# Patient Record
Sex: Male | Born: 2000 | Race: White | Hispanic: No | Marital: Single | Smoking: Current every day smoker
Health system: Southern US, Community
[De-identification: ages and names within clinical notes are randomized; demographics above are authoritative.]

## PROBLEM LIST (undated history)

## (undated) DIAGNOSIS — S069X9A Unspecified intracranial injury with loss of consciousness of unspecified duration, initial encounter: Secondary | ICD-10-CM

## (undated) DIAGNOSIS — S069XAA Unspecified intracranial injury with loss of consciousness status unknown, initial encounter: Secondary | ICD-10-CM

## (undated) HISTORY — PX: TONSILLECTOMY: SUR1361

## (undated) HISTORY — PX: ADENOIDECTOMY: SHX5191

## (undated) HISTORY — PX: TYMPANOSTOMY TUBE PLACEMENT: SHX32

---

## 2020-05-28 ENCOUNTER — Emergency Department (HOSPITAL_BASED_OUTPATIENT_CLINIC_OR_DEPARTMENT_OTHER)
Admission: EM | Admit: 2020-05-28 | Discharge: 2020-05-28 | Disposition: A | Discharge status: Home / Self Care | Payer: Self-pay | Attending: Emergency Medicine | Admitting: Emergency Medicine

## 2020-05-28 ENCOUNTER — Emergency Department (HOSPITAL_BASED_OUTPATIENT_CLINIC_OR_DEPARTMENT_OTHER): Payer: Self-pay

## 2020-05-28 ENCOUNTER — Other Ambulatory Visit: Payer: Self-pay

## 2020-05-28 ENCOUNTER — Encounter (HOSPITAL_BASED_OUTPATIENT_CLINIC_OR_DEPARTMENT_OTHER): Payer: Self-pay

## 2020-05-28 DIAGNOSIS — R1011 Right upper quadrant pain: Secondary | ICD-10-CM | POA: Insufficient documentation

## 2020-05-28 DIAGNOSIS — F1010 Alcohol abuse, uncomplicated: Secondary | ICD-10-CM | POA: Insufficient documentation

## 2020-05-28 DIAGNOSIS — F17210 Nicotine dependence, cigarettes, uncomplicated: Secondary | ICD-10-CM | POA: Insufficient documentation

## 2020-05-28 DIAGNOSIS — R1010 Upper abdominal pain, unspecified: Secondary | ICD-10-CM

## 2020-05-28 DIAGNOSIS — R112 Nausea with vomiting, unspecified: Secondary | ICD-10-CM | POA: Insufficient documentation

## 2020-05-28 LAB — URINALYSIS, ROUTINE W REFLEX MICROSCOPIC
Bilirubin Urine: NEGATIVE
Glucose, UA: NEGATIVE mg/dL
Ketones, ur: NEGATIVE mg/dL
Leukocytes,Ua: NEGATIVE
Nitrite: NEGATIVE
Protein, ur: NEGATIVE mg/dL
Specific Gravity, Urine: 1.01 (ref 1.005–1.030)
pH: 7 (ref 5.0–8.0)

## 2020-05-28 LAB — CBC WITH DIFFERENTIAL/PLATELET
Abs Immature Granulocytes: 0.09 10*3/uL — ABNORMAL HIGH (ref 0.00–0.07)
Basophils Absolute: 0.1 10*3/uL (ref 0.0–0.1)
Basophils Relative: 1 %
Eosinophils Absolute: 0.1 10*3/uL (ref 0.0–0.5)
Eosinophils Relative: 1 %
HCT: 49.7 % (ref 39.0–52.0)
Hemoglobin: 16 g/dL (ref 13.0–17.0)
Immature Granulocytes: 1 %
Lymphocytes Relative: 12 %
Lymphs Abs: 1.2 10*3/uL (ref 0.7–4.0)
MCH: 28.6 pg (ref 26.0–34.0)
MCHC: 32.2 g/dL (ref 30.0–36.0)
MCV: 88.8 fL (ref 80.0–100.0)
Monocytes Absolute: 0.7 10*3/uL (ref 0.1–1.0)
Monocytes Relative: 7 %
Neutro Abs: 7.9 10*3/uL — ABNORMAL HIGH (ref 1.7–7.7)
Neutrophils Relative %: 78 %
Platelets: 288 10*3/uL (ref 150–400)
RBC: 5.6 MIL/uL (ref 4.22–5.81)
RDW: 13.5 % (ref 11.5–15.5)
WBC: 10 10*3/uL (ref 4.0–10.5)
nRBC: 0 % (ref 0.0–0.2)

## 2020-05-28 LAB — COMPREHENSIVE METABOLIC PANEL
ALT: 52 U/L — ABNORMAL HIGH (ref 0–44)
AST: 35 U/L (ref 15–41)
Albumin: 4 g/dL (ref 3.5–5.0)
Alkaline Phosphatase: 92 U/L (ref 38–126)
Anion gap: 11 (ref 5–15)
BUN: 8 mg/dL (ref 6–20)
CO2: 26 mmol/L (ref 22–32)
Calcium: 9.3 mg/dL (ref 8.9–10.3)
Chloride: 106 mmol/L (ref 98–111)
Creatinine, Ser: 0.64 mg/dL (ref 0.61–1.24)
GFR calc Af Amer: >60 mL/min (ref >60)
GFR calc non Af Amer: >60 mL/min (ref >60)
Glucose, Bld: 109 mg/dL — ABNORMAL HIGH (ref 70–99)
Potassium: 4.3 mmol/L (ref 3.5–5.1)
Sodium: 143 mmol/L (ref 135–145)
Total Bilirubin: 0.6 mg/dL (ref 0.3–1.2)
Total Protein: 7 g/dL (ref 6.5–8.1)

## 2020-05-28 LAB — URINALYSIS, MICROSCOPIC (REFLEX)
Squamous Epithelial / HPF: NONE SEEN (ref 0–5)
WBC, UA: NONE SEEN WBC/hpf (ref 0–5)

## 2020-05-28 LAB — LIPASE, BLOOD: Lipase: 24 U/L (ref 11–51)

## 2020-05-28 MED ORDER — PANTOPRAZOLE SODIUM 40 MG IV SOLR
40.0000 mg | Freq: Once | INTRAVENOUS | Status: AC
  Administered 2020-05-28: 40 mg via INTRAVENOUS
  Filled 2020-05-28: qty 40

## 2020-05-28 MED ORDER — ONDANSETRON HCL 4 MG/2ML IJ SOLN
4.0000 mg | Freq: Once | INTRAMUSCULAR | Status: AC
  Administered 2020-05-28: 4 mg via INTRAVENOUS
  Filled 2020-05-28: qty 2

## 2020-05-28 MED ORDER — ESOMEPRAZOLE MAGNESIUM 40 MG PO CPDR
40.0000 mg | DELAYED_RELEASE_CAPSULE | Freq: Every day | ORAL | 0 refills | Status: AC
Start: 2020-05-28 — End: ?

## 2020-05-28 MED ORDER — ONDANSETRON 4 MG PO TBDP: ORAL_TABLET | ORAL | 0 refills | Status: AC

## 2020-05-28 MED ORDER — SODIUM CHLORIDE 0.9 % IV BOLUS
1000.0000 mL | Freq: Once | INTRAVENOUS | Status: AC
  Administered 2020-05-28: 1000 mL via INTRAVENOUS

## 2020-05-28 NOTE — ED Notes (Signed)
Patient ambulated to US

## 2020-05-28 NOTE — ED Triage Notes (Signed)
Pt states that he woke up with pain to RUQ starting this morning reports NV.

## 2020-05-28 NOTE — Discharge Instructions (Signed)
Your work-up today is reassuring and I suspect most of your symptoms are due to inflammation and irritation of your stomach lining, likely due to alcohol use.  Please begin taking Nexium daily to help reduce stomach acid, use Zofran as needed for nausea and vomiting.  You can use Maalox as needed for breakthrough symptoms.  It is important that you reduce your alcohol intake, you can use the resources provided for help with this.

## 2020-05-28 NOTE — ED Provider Notes (Signed)
[facility] EMERGENCY DEPARTMENT Provider Note   CSN: [id number] Arrival date & time: 05/28/20  8469     History Chief Complaint  Patient presents with  . Abdominal Pain    Justin Taylor is a 19 y.o. male.  Justin Taylor is a 19 y.o. male with a history of alcohol abuse, who presents with RUQ pain and vomiting that began this morning. Patient tried to go to work today but had multiple episodes of vomiting at work and continued right upper quadrant and upper abdominal pain.  He came here today and did have some improvement in pain after having a bowel movement, reports this was normal denies any blood in his stool or emesis.  He denies any fevers or chills.  He states that he has had issues with his stomach for a long time but never with pain is severe, was previously seeing a doctor in [place] and was put on a medicine to help with acid reflux but ran out of this and then moved here, has not seen a GI doctor here.  He denies any associated chest pain or shortness of breath.  No urinary symptoms.  He does report that he vapes and also drinks anywhere from 3-8 liquor drinks daily, had 3 or 4 last night.  He has a history of elevated liver enzymes, and has not had these rechecked.  Patient states he has a strong family history of alcoholism.        History reviewed. No pertinent past medical history.  There are no problems to display for this patient.   Past Surgical History:  Procedure Laterality Date  . TONSILLECTOMY         No family history on file.  Social History   Tobacco Use  . Smoking status: Current Every Day Smoker    Types: Cigarettes  . Smokeless tobacco: Never Used  Vaping Use  . Vaping Use: Every day  Substance Use Topics  . Alcohol use: Yes  . Drug use: Never    Home Medications Prior to Admission medications   Medication Sig Start Date End Date Taking? Authorizing Provider  esomeprazole (NEXIUM) 40 MG capsule Take 1 capsule (40 mg total) by  mouth daily. 05/28/20   Dartha Lodge, PA-C  ondansetron (ZOFRAN ODT) 4 MG disintegrating tablet 4mg  ODT q4 hours prn nausea/vomit 05/28/20   07/28/20, PA-C    Allergies    Trimethoprim and Sulfamethoxazole-trimethoprim  Review of Systems   Review of Systems  Constitutional: Negative for chills and fever.  HENT: Negative.   Respiratory: Negative for cough and shortness of breath.   Gastrointestinal: Positive for abdominal pain, nausea and vomiting. Negative for blood in stool, constipation and diarrhea.  Genitourinary: Negative for dysuria and frequency.  Musculoskeletal: Negative for arthralgias and myalgias.  Skin: Negative for color change and rash.  Neurological: Negative for dizziness, syncope and light-headedness.  All other systems reviewed and are negative.   Physical Exam Updated Vital Signs BP 122/60 (BP Location: Left Arm)   Pulse 95   Temp 98.7 F (37.1 C) (Oral)   Resp 18   Ht 5\' 8"  (1.727 m)   Wt 81.6 kg   SpO2 97%   BMI 27.37 kg/m   Physical Exam Vitals and nursing note reviewed.  Constitutional:      General: He is not in acute distress.    Appearance: He is well-developed and normal weight. He is not ill-appearing or diaphoretic.  HENT:     Head: Normocephalic  and atraumatic.     Mouth/Throat:     Comments: Mucous membranes slightly dry Eyes:     General:        Right eye: No discharge.        Left eye: No discharge.     Pupils: Pupils are equal, round, and reactive to light.  Cardiovascular:     Rate and Rhythm: Normal rate and regular rhythm.     Heart sounds: Normal heart sounds.  Pulmonary:     Effort: Pulmonary effort is normal. No respiratory distress.     Breath sounds: Normal breath sounds. No wheezing or rales.  Abdominal:     General: Abdomen is flat. Bowel sounds are normal. There is no distension.     Palpations: Abdomen is soft. There is no mass.     Tenderness: There is abdominal tenderness in the right upper quadrant,  epigastric area and left upper quadrant. There is no guarding.     Comments: Abdomen is soft, nondistended, bowel sounds present throughout, there is mild tenderness across the upper abdomen, most severe in the epigastric region, no lower abdominal tenderness, no guarding or peritoneal signs  Musculoskeletal:        General: No deformity.     Cervical back: Neck supple.  Skin:    General: Skin is warm and dry.     Capillary Refill: Capillary refill takes less than 2 seconds.  Neurological:     Mental Status: He is alert.     Coordination: Coordination normal.     Comments: Speech is clear, able to follow commands Moves extremities without ataxia, coordination intact  Psychiatric:        Mood and Affect: Mood normal.        Behavior: Behavior normal.     ED Results / Procedures / Treatments   Labs (all labs ordered are listed, but only abnormal results are displayed) Labs Reviewed  URINALYSIS, ROUTINE W REFLEX MICROSCOPIC - Abnormal; Notable for the following components:      Result Value   Hgb urine dipstick TRACE (*)    All other components within normal limits  CBC WITH DIFFERENTIAL/PLATELET - Abnormal; Notable for the following components:   Neutro Abs 7.9 (*)    Abs Immature Granulocytes 0.09 (*)    All other components within normal limits  URINALYSIS, MICROSCOPIC (REFLEX) - Abnormal; Notable for the following components:   Bacteria, UA RARE (*)    All other components within normal limits  COMPREHENSIVE METABOLIC PANEL - Abnormal; Notable for the following components:   Glucose, Bld 109 (*)    ALT 52 (*)    All other components within normal limits  LIPASE, BLOOD    EKG None  Radiology US Abdomen Limited RUQ  Result Date: 05/28/2020 CLINICAL DATA:  Nausea and vomiting EXAM: ULTRASOUND ABDOMEN LIMITED RIGHT UPPER QUADRANT COMPARISON:  None. FINDINGS: Gallbladder: No gallstones or wall thickening visualized. No sonographic Murphy sign noted by sonographer. Common  bile duct: Diameter: 4 mm, normal Liver: No focal lesion identified. Within normal limits in parenchymal echogenicity. Portal vein is patent on color Doppler imaging with normal direction of blood flow towards the liver. Other: None. IMPRESSION: No sonographic etiology for abdominal pain identified. Electronically Signed   By: Meda Klinefelter MD   On: 05/28/2020 11:24    Procedures Procedures (including critical care time)  Medications Ordered in ED Medications  sodium chloride 0.9 % bolus 1,000 mL ( Intravenous Stopped 05/28/20 1205)  ondansetron (ZOFRAN) injection 4 mg (4  mg Intravenous Given 05/28/20 1103)  pantoprazole (PROTONIX) injection 40 mg (40 mg Intravenous Given 05/28/20 1103)    ED Course  I have reviewed the triage vital signs and the nursing notes.  Pertinent labs & imaging results that were available during my care of the patient were reviewed by me and considered in my medical decision making (see chart for details).    MDM Rules/Calculators/A&P                          Patient presents to the ED with complaints of abdominal pain. Patient nontoxic appearing, in no apparent distress, vitals WNL. On exam patient tender to palpation across the upper abdomen, no peritoneal signs. Will evaluate with labs and right upper quadrant ultrasound. Analgesics, anti-emetics, and fluids administered.   ER work-up reviewed:  CBC: No leukocytosis, normal hemoglobin  CMP: No significant electrolyte derangements, normal renal function, ALT of 52 but no other elevated liver enzymes, these appear to have improved from previous Lipase: WNL UA: No evidence of infection Imaging: Right upper quadrant ultrasound without evidence of acute cholecystitis or other biliary or liver disease  On repeat abdominal exam patient remains without peritoneal signs, doubt cholecystitis, pancreatitis, diverticulitis, appendicitis, bowel obstruction/perforation. Patient tolerating PO in the emergency department.   Had a long conversation with patient about decreasing his alcohol use and provided him information for alcohol treatment.  Suspect a lot of his symptoms may be due to gastritis.  Will discharge home with supportive measures. I discussed results, treatment plan, need for PCP follow-up, and return precautions with the patient. Provided opportunity for questions, patient confirmed understanding and is in agreement with plan.    Final Clinical Impression(s) / ED Diagnoses Final diagnoses:  Upper abdominal pain  Non-intractable vomiting with nausea, unspecified vomiting type  Alcohol abuse    Rx / DC Orders ED Discharge Orders         Ordered    esomeprazole (NEXIUM) 40 MG capsule  Daily     Discontinue  Reprint     05/28/20 1242    ondansetron (ZOFRAN ODT) 4 MG disintegrating tablet     Discontinue  Reprint     05/28/20 1242           Dartha Lodge, PA-C 05/28/20 1243    Alvira Monday, MD 05/28/20 2318

## 2020-05-28 NOTE — ED Notes (Signed)
ED Provider student) at bedside.

## 2020-05-28 NOTE — ED Notes (Signed)
ED Provider at bedside. 

## 2020-06-21 ENCOUNTER — Encounter (HOSPITAL_BASED_OUTPATIENT_CLINIC_OR_DEPARTMENT_OTHER): Payer: Self-pay | Admitting: Emergency Medicine

## 2020-06-21 ENCOUNTER — Other Ambulatory Visit: Payer: Self-pay

## 2020-06-21 ENCOUNTER — Emergency Department (HOSPITAL_BASED_OUTPATIENT_CLINIC_OR_DEPARTMENT_OTHER): Payer: Self-pay

## 2020-06-21 ENCOUNTER — Emergency Department (HOSPITAL_BASED_OUTPATIENT_CLINIC_OR_DEPARTMENT_OTHER)
Admission: EM | Admit: 2020-06-21 | Discharge: 2020-06-21 | Disposition: A | Discharge status: Home / Self Care | Payer: Self-pay | Attending: Emergency Medicine | Admitting: Emergency Medicine

## 2020-06-21 DIAGNOSIS — R109 Unspecified abdominal pain: Secondary | ICD-10-CM

## 2020-06-21 DIAGNOSIS — F17210 Nicotine dependence, cigarettes, uncomplicated: Secondary | ICD-10-CM | POA: Insufficient documentation

## 2020-06-21 DIAGNOSIS — Y999 Unspecified external cause status: Secondary | ICD-10-CM | POA: Insufficient documentation

## 2020-06-21 DIAGNOSIS — Y939 Activity, unspecified: Secondary | ICD-10-CM | POA: Insufficient documentation

## 2020-06-21 DIAGNOSIS — Y929 Unspecified place or not applicable: Secondary | ICD-10-CM | POA: Insufficient documentation

## 2020-06-21 DIAGNOSIS — R1030 Lower abdominal pain, unspecified: Secondary | ICD-10-CM

## 2020-06-21 DIAGNOSIS — X500XXA Overexertion from strenuous movement or load, initial encounter: Secondary | ICD-10-CM | POA: Insufficient documentation

## 2020-06-21 DIAGNOSIS — S76911A Strain of unspecified muscles, fascia and tendons at thigh level, right thigh, initial encounter: Secondary | ICD-10-CM | POA: Insufficient documentation

## 2020-06-21 DIAGNOSIS — R300 Dysuria: Secondary | ICD-10-CM

## 2020-06-21 LAB — URINALYSIS, ROUTINE W REFLEX MICROSCOPIC
Bilirubin Urine: NEGATIVE
Glucose, UA: NEGATIVE mg/dL
Ketones, ur: NEGATIVE mg/dL
Leukocytes,Ua: NEGATIVE
Nitrite: NEGATIVE
Protein, ur: NEGATIVE mg/dL
Specific Gravity, Urine: <1.005 — ABNORMAL LOW (ref 1.005–1.030)
pH: 7 (ref 5.0–8.0)

## 2020-06-21 LAB — URINALYSIS, MICROSCOPIC (REFLEX)
Bacteria, UA: NONE SEEN
Squamous Epithelial / HPF: NONE SEEN (ref 0–5)
WBC, UA: NONE SEEN WBC/hpf (ref 0–5)

## 2020-06-21 NOTE — ED Provider Notes (Signed)
[facility] DEPT [facility] Emergency Department Provider Note MRN:  [id number]  Arrival date & time: 06/21/20     Chief Complaint   Groin Pain and Dysuria   History of Present Illness   Justin Taylor is a 19 y.o. year-old male with no pertinent past medical history presenting to the ED with chief complaint of groin pain.  Some burning with urination yesterday.  Also noting some pain to the right groin region, right flank.  Worse with certain motions and positions when laying in bed.  Has been lifting a lot of heavy objects at work recently.  Denies any bowel or bladder dysfunction, no numbness or weakness to the arms or legs, no back pain, no chest pain or shortness of breath, no abdominal pain, no fever.  Review of Systems  A complete 10 system review of systems was obtained and all systems are negative except as noted in the HPI and PMH.   Patient's Health History   History reviewed. No pertinent past medical history.  Past Surgical History:  Procedure Laterality Date  . TONSILLECTOMY      No family history on file.  Social History   Socioeconomic History  . Marital status: Single    Spouse name: Not on file  . Number of children: Not on file  . Years of education: Not on file  . Highest education level: Not on file  Occupational History  . Not on file  Tobacco Use  . Smoking status: Current Every Day Smoker    Types: Cigarettes  . Smokeless tobacco: Never Used  Vaping Use  . Vaping Use: Every day  Substance and Sexual Activity  . Alcohol use: Yes  . Drug use: Never  . Sexual activity: Not on file  Other Topics Concern  . Not on file  Social History Narrative  . Not on file   Social Determinants of Health   Financial Resource Strain:   . Difficulty of Paying Living Expenses: Not on file  Food Insecurity:   . Worried About Programme researcher, broadcasting/film/video in the Last Year: Not on file  . Ran Out of Food in the Last Year: Not on file  Transportation Needs:    . Lack of Transportation (Medical): Not on file  . Lack of Transportation (Non-Medical): Not on file  Physical Activity:   . Days of Exercise per Week: Not on file  . Minutes of Exercise per Session: Not on file  Stress:   . Feeling of Stress : Not on file  Social Connections:   . Frequency of Communication with Friends and Family: Not on file  . Frequency of Social Gatherings with Friends and Family: Not on file  . Attends Religious Services: Not on file  . Active Member of Clubs or Organizations: Not on file  . Attends Banker Meetings: Not on file  . Marital Status: Not on file  Intimate Partner Violence:   . Fear of Current or Ex-Partner: Not on file  . Emotionally Abused: Not on file  . Physically Abused: Not on file  . Sexually Abused: Not on file     Physical Exam   Vitals:   06/21/20 1819 06/21/20 2137  BP: (!) 144/79 (!) 137/57  Pulse: 92 (!) 59  Resp: 18 20  Temp: 99.6 F (37.6 C)   SpO2: 100% 99%    CONSTITUTIONAL: Well-appearing, NAD NEURO:  Alert and oriented x 3, no focal deficits EYES:  eyes equal and reactive ENT/NECK:  no LAD,  no JVD CARDIO: Regular rate, well-perfused, normal S1 and S2 PULM:  CTAB no wheezing or rhonchi GI/GU:  normal bowel sounds, non-distended, non-tender MSK/SPINE:  No gross deformities, no edema SKIN:  no rash, atraumatic PSYCH:  Appropriate speech and behavior  *Additional and/or pertinent findings included in MDM below  Diagnostic and Interventional Summary    EKG Interpretation  Date/Time:    Ventricular Rate:    PR Interval:    QRS Duration:   QT Interval:    QTC Calculation:   R Axis:     Text Interpretation:        Labs Reviewed  URINALYSIS, ROUTINE W REFLEX MICROSCOPIC - Abnormal; Notable for the following components:      Result Value   Color, Urine STRAW (*)    Specific Gravity, Urine <1.005 (*)    Hgb urine dipstick TRACE (*)    All other components within normal limits  URINALYSIS,  MICROSCOPIC (REFLEX)  GC/CHLAMYDIA PROBE AMP ([place]) NOT AT [facility]    US SCROTUM W/DOPPLER  Final Result      Medications - No data to display   Procedures  /  Critical Care Procedures  ED Course and Medical Decision Making  I have reviewed the triage vital signs, the nursing notes, and pertinent available records from the EMR.  Listed above are laboratory and imaging tests that I personally ordered, reviewed, and interpreted and then considered in my medical decision making (see below for details).  Dysuria and seemingly musculoskeletal strain to the groin and/or flank.  No McBurney's point tenderness, doubt appendicitis.  Normal-appearing external genitalia, ultrasound without evidence of torsion or abnormalities to the scrotum.  Urinalysis is without evidence of infection, doubt kidney stone.  Patient is in a monogamous relationship and is not concerned about STIs though this is still a possibility.  Appropriate for discharge with reassurance and anti-inflammatories.       Elmer Sow. Pilar Plate, MD [facility] Emergency Medicine [facility] [email or web address]  Final Clinical Impressions(s) / ED Diagnoses     ICD-10-CM   1. Dysuria  R30.0   2. Groin pain  R10.30 US SCROTUM W/DOPPLER    US SCROTUM W/DOPPLER  3. Flank pain  R10.9     ED Discharge Orders    None       Discharge Instructions Discussed with and Provided to Patient:     Discharge Instructions     You were evaluated in the Emergency Department and after careful evaluation, we did not find any emergent condition requiring admission or further testing in the hospital.  Your exam/testing today is overall reassuring.  Your symptoms seem to be due to strain or sprain of the muscles.  Please use Tylenol or Motrin at home for discomfort and rest for the next 24 to 48 hours.  Please return to the Emergency Department if you experience any worsening of your condition.   Thank you for allowing [place] to  be a part of your care.      Sabas Sous, MD 06/21/20 2241

## 2020-06-21 NOTE — ED Triage Notes (Signed)
Right groin pain off and on since yesterday, worse today, c/o dysuria. Denies swelling or severe pain at present.

## 2020-06-21 NOTE — Discharge Instructions (Signed)
You were evaluated in the Emergency Department and after careful evaluation, we did not find any emergent condition requiring admission or further testing in the hospital.  Your exam/testing today is overall reassuring.  Your symptoms seem to be due to strain or sprain of the muscles.  Please use Tylenol or Motrin at home for discomfort and rest for the next 24 to 48 hours.  Please return to the Emergency Department if you experience any worsening of your condition.   Thank you for allowing [place] to be a part of your care.

## 2020-06-24 LAB — GC/CHLAMYDIA PROBE AMP (~~LOC~~) NOT AT ARMC
Chlamydia: NEGATIVE
Comment: NEGATIVE
Comment: NORMAL
Neisseria Gonorrhea: NEGATIVE

## 2020-08-07 ENCOUNTER — Encounter (HOSPITAL_BASED_OUTPATIENT_CLINIC_OR_DEPARTMENT_OTHER): Payer: Self-pay | Admitting: Emergency Medicine

## 2020-08-07 ENCOUNTER — Other Ambulatory Visit: Payer: Self-pay

## 2020-08-07 ENCOUNTER — Emergency Department (HOSPITAL_BASED_OUTPATIENT_CLINIC_OR_DEPARTMENT_OTHER)
Admission: EM | Admit: 2020-08-07 | Discharge: 2020-08-07 | Disposition: A | Discharge status: Home / Self Care | Payer: Self-pay | Attending: Emergency Medicine | Admitting: Emergency Medicine

## 2020-08-07 DIAGNOSIS — Y9289 Other specified places as the place of occurrence of the external cause: Secondary | ICD-10-CM | POA: Insufficient documentation

## 2020-08-07 DIAGNOSIS — W311XXA Contact with metalworking machines, initial encounter: Secondary | ICD-10-CM | POA: Insufficient documentation

## 2020-08-07 DIAGNOSIS — T1591XA Foreign body on external eye, part unspecified, right eye, initial encounter: Secondary | ICD-10-CM | POA: Diagnosis present

## 2020-08-07 DIAGNOSIS — K2920 Alcoholic gastritis without bleeding: Secondary | ICD-10-CM | POA: Diagnosis not present

## 2020-08-07 DIAGNOSIS — Y990 Civilian activity done for income or pay: Secondary | ICD-10-CM | POA: Diagnosis not present

## 2020-08-07 DIAGNOSIS — F17210 Nicotine dependence, cigarettes, uncomplicated: Secondary | ICD-10-CM | POA: Diagnosis not present

## 2020-08-07 DIAGNOSIS — Z23 Encounter for immunization: Secondary | ICD-10-CM | POA: Insufficient documentation

## 2020-08-07 LAB — COMPREHENSIVE METABOLIC PANEL
ALT: 47 U/L — ABNORMAL HIGH (ref 0–44)
AST: 37 U/L (ref 15–41)
Albumin: 4.4 g/dL (ref 3.5–5.0)
Alkaline Phosphatase: 78 U/L (ref 38–126)
Anion gap: 10 (ref 5–15)
BUN: 11 mg/dL (ref 6–20)
CO2: 26 mmol/L (ref 22–32)
Calcium: 9.7 mg/dL (ref 8.9–10.3)
Chloride: 105 mmol/L (ref 98–111)
Creatinine, Ser: 0.7 mg/dL (ref 0.61–1.24)
GFR, Estimated: >60 mL/min (ref >60)
Glucose, Bld: 89 mg/dL (ref 70–99)
Potassium: 3.8 mmol/L (ref 3.5–5.1)
Sodium: 141 mmol/L (ref 135–145)
Total Bilirubin: 0.4 mg/dL (ref 0.3–1.2)
Total Protein: 7.1 g/dL (ref 6.5–8.1)

## 2020-08-07 LAB — CBC WITH DIFFERENTIAL/PLATELET
Abs Immature Granulocytes: 0.05 10*3/uL (ref 0.00–0.07)
Basophils Absolute: 0.1 10*3/uL (ref 0.0–0.1)
Basophils Relative: 1 %
Eosinophils Absolute: 0.5 10*3/uL (ref 0.0–0.5)
Eosinophils Relative: 4 %
HCT: 45.2 % (ref 39.0–52.0)
Hemoglobin: 14.6 g/dL (ref 13.0–17.0)
Immature Granulocytes: 1 %
Lymphocytes Relative: 19 %
Lymphs Abs: 2.1 10*3/uL (ref 0.7–4.0)
MCH: 29.3 pg (ref 26.0–34.0)
MCHC: 32.3 g/dL (ref 30.0–36.0)
MCV: 90.8 fL (ref 80.0–100.0)
Monocytes Absolute: 1.3 10*3/uL — ABNORMAL HIGH (ref 0.1–1.0)
Monocytes Relative: 12 %
Neutro Abs: 7 10*3/uL (ref 1.7–7.7)
Neutrophils Relative %: 63 %
Platelets: 278 10*3/uL (ref 150–400)
RBC: 4.98 MIL/uL (ref 4.22–5.81)
RDW: 14.1 % (ref 11.5–15.5)
WBC: 10.9 10*3/uL — ABNORMAL HIGH (ref 4.0–10.5)
nRBC: 0 % (ref 0.0–0.2)

## 2020-08-07 LAB — URINALYSIS, ROUTINE W REFLEX MICROSCOPIC
Bilirubin Urine: NEGATIVE
Glucose, UA: NEGATIVE mg/dL
Ketones, ur: NEGATIVE mg/dL
Leukocytes,Ua: NEGATIVE
Nitrite: NEGATIVE
Protein, ur: NEGATIVE mg/dL
Specific Gravity, Urine: <1.005 — ABNORMAL LOW (ref 1.005–1.030)
pH: 6.5 (ref 5.0–8.0)

## 2020-08-07 LAB — URINALYSIS, MICROSCOPIC (REFLEX)

## 2020-08-07 LAB — LIPASE, BLOOD: Lipase: 30 U/L (ref 11–51)

## 2020-08-07 MED ORDER — ERYTHROMYCIN 5 MG/GM OP OINT
TOPICAL_OINTMENT | Freq: Once | OPHTHALMIC | Status: AC
  Administered 2020-08-07: 1 via OPHTHALMIC
  Filled 2020-08-07: qty 3.5

## 2020-08-07 MED ORDER — TETANUS-DIPHTH-ACELL PERTUSSIS 5-2.5-18.5 LF-MCG/0.5 IM SUSP
0.5000 mL | Freq: Once | INTRAMUSCULAR | Status: AC
  Administered 2020-08-07: 0.5 mL via INTRAMUSCULAR
  Filled 2020-08-07: qty 0.5

## 2020-08-07 MED ORDER — TETRACAINE HCL 0.5 % OP SOLN
1.0000 [drp] | Freq: Once | OPHTHALMIC | Status: AC
  Administered 2020-08-07: 1 [drp] via OPHTHALMIC
  Filled 2020-08-07: qty 4

## 2020-08-07 NOTE — ED Provider Notes (Signed)
[facility] EMERGENCY DEPARTMENT Provider Note   CSN: [id number] Arrival date & time: 08/07/20  1825     History Chief Complaint  Patient presents with  . metal in eye    Justin Taylor is a 19 y.o. male.  Patient is a 19 year old male with no significant past medical history who presents today because he has something in his right eye.  Patient reports earlier today he felt a burning and stinging when he would blink and he thought he just had an eyelash in his eye.  However after showering and looking in the mirror he noticed there was metal in his eye.  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  He went back to work because they have an eye magnet but they were not able to get the metal out.  He denies any change in his vision and reports just every time he blinks it stings.  He has no other complaints at this time.  The history is provided by the patient.       History reviewed. No pertinent past medical history.  There are no problems to display for this patient.   Past Surgical History:  Procedure Laterality Date  . TONSILLECTOMY    . TYMPANOSTOMY TUBE PLACEMENT         No family history on file.  Social History   Tobacco Use  . Smoking status: Current Every Day Smoker    Packs/day: 0.10    Types: Cigarettes  . Smokeless tobacco: Never Used  Vaping Use  . Vaping Use: Every day  Substance Use Topics  . Alcohol use: Yes    Comment: daily beer  . Drug use: Never    Home Medications Prior to Admission medications   Medication Sig Start Date End Date Taking? Authorizing Provider  esomeprazole (NEXIUM) 40 MG capsule Take 1 capsule (40 mg total) by mouth daily. 05/28/20   Dartha Lodge, PA-C  ondansetron (ZOFRAN ODT) 4 MG disintegrating tablet 4mg  ODT q4 hours prn nausea/vomit 05/28/20   07/28/20, PA-C    Allergies    Primsol [trimethoprim] and  Sulfamethoxazole-trimethoprim  Review of Systems   Review of Systems  All other systems reviewed and are negative.   Physical Exam Updated Vital Signs BP 137/77 (BP Location: Left Arm)   Pulse (!) 104   Temp 99 F (37.2 C) (Oral)   Resp 18   Ht 5\' 7"  (1.702 m)   Wt 77.1 kg   SpO2 100%   BMI 26.63 kg/m   Physical Exam Vitals and nursing note reviewed.  Constitutional:      General: He is not in acute distress.    Appearance: Normal appearance. He is normal weight.  HENT:     Head: Normocephalic.     Nose: Nose normal.     Mouth/Throat:     Mouth: Mucous membranes are moist.  Eyes:     General: Lids are normal. Lids are everted, no foreign bodies appreciated. Vision grossly intact. Gaze aligned appropriately.        Right eye: No discharge.     Extraocular Movements: Extraocular movements intact.     Conjunctiva/sclera:     Right eye: Right conjunctiva is injected. No hemorrhage.    Pupils: Pupils are equal, round, and reactive to light.     Slit lamp exam:    Right eye: Foreign body present. No hyphema, hypopyon  or photophobia.   Cardiovascular:     Rate and Rhythm: Tachycardia present.  Pulmonary:     Effort: Pulmonary effort is normal.     Breath sounds: Normal breath sounds. No wheezing.  Chest:     Chest wall: No tenderness.  Abdominal:     General: Abdomen is flat.     Tenderness: There is no guarding or rebound.     Comments: Slight right lower quadrant and right CVA tenderness  Skin:    General: Skin is warm.  Neurological:     Mental Status: He is alert. Mental status is at baseline.  Psychiatric:        Mood and Affect: Mood normal.        Behavior: Behavior normal.        Thought Content: Thought content normal.      ED Results / Procedures / Treatments   Labs (all labs ordered are listed, but only abnormal results are displayed) Labs Reviewed - No data to display  EKG None  Radiology No results found.  Procedures .Foreign Body  Removal  Date/Time: 08/07/2020 7:21 PM Performed by: Gwyneth Sprout, MD Authorized by: Gwyneth Sprout, MD  Body area: eye Location details: right cornea Anesthesia: local infiltration  Anesthesia: Local Anesthetic: tetracaine drops  Sedation: Patient sedated: no  Patient restrained: no Patient cooperative: yes Localization method: visualized Removal mechanism: 25-gauge needle Eye not examined with fluorescein. Corneal abrasion size: small No residual rust ring present. Dressing: antibiotic ointment Depth: embedded Complexity: simple 1 objects recovered. Objects recovered: metal Post-procedure assessment: foreign body removed Patient tolerance: patient tolerated the procedure well with no immediate complications   (including critical care time)  Medications Ordered in ED Medications  tetracaine (PONTOCAINE) 0.5 % ophthalmic solution 1 drop (has no administration in time range)    ED Course  I have reviewed the triage vital signs and the nursing notes.  Pertinent labs & imaging results that were available during my care of the patient were reviewed by me and considered in my medical decision making (see chart for details).    MDM Rules/Calculators/A&P                          Patient presenting today with a piece of metal in his right eye.  This occurred while he was at work.  Patient has no visual complaints and only mild localized irritation.  Will attempt removal after areas anesthetized.    7:23 PM When going back into remove the foreign body patient's girlfriend is now present and reports they actually have something else that he wants to talk about.  Patient turned 19 on Monday and reports went out drinking and drink heavily.  On Tuesday morning he woke up and he has had ongoing abdominal pain since that time.  It is lower pain that radiates to his right back.  He is urinating frequently but denies dysuria.  He had one episode of nausea and vomiting on  Tuesday morning but has had no further nausea or vomiting.  He has been eating but less than usual.  He has not noticed any darkening of his urine and denies any fevers.   8:41 PM Labs are reassuring.  Suspect patient has some alcoholic gastritis from heavy drinking on Monday before symptoms started.  Foreign body was removed as above.  Patient given erythromycin ointment and ophthalmology follow-up if symptoms of the eye worsen.  MDM Number of Diagnoses or Management Options  Amount and/or Complexity of Data Reviewed Clinical lab tests: ordered Independent visualization of images, tracings, or specimens: yes  Risk of Complications, Morbidity, and/or Mortality Presenting problems: moderate Diagnostic procedures: low Management options: low  Patient Progress Patient progress: stable  Final Clinical Impression(s) / ED Diagnoses Final diagnoses:  Foreign body in eye, right, initial encounter  Acute alcoholic gastritis without hemorrhage    Rx / DC Orders ED Discharge Orders    None       Gwyneth Sprout, MD 08/07/20 2042

## 2020-08-07 NOTE — Discharge Instructions (Signed)
Use the ointment in your eye 2 times a day for the next few days.  If you are still experiencing any symptoms in 3 to 4 days you need to see the eye doctor to have a recheck.  Avoid alcohol and continue eating a bland diet for the next few days and your abdominal pain will most likely just go away.  All your labs look normal today.  Make sure you are drinking plenty of fluids to stay hydrated.

## 2020-08-07 NOTE — ED Triage Notes (Signed)
Piece of metal in right eye since about noon.  Pt thought he had an eyelash in his eye all afternoon but after he got home and took his shower and looked in the mirror he saw the piece of metal. Pt is a Psychologist, occupational.

## 2020-08-12 ENCOUNTER — Other Ambulatory Visit: Payer: Self-pay

## 2020-08-12 DIAGNOSIS — Z20822 Contact with and (suspected) exposure to covid-19: Secondary | ICD-10-CM

## 2020-08-13 LAB — NOVEL CORONAVIRUS, NAA: SARS-CoV-2, NAA: NOT DETECTED

## 2020-08-13 LAB — SARS-COV-2, NAA 2 DAY TAT

## 2020-09-10 ENCOUNTER — Emergency Department (HOSPITAL_BASED_OUTPATIENT_CLINIC_OR_DEPARTMENT_OTHER): Payer: Self-pay

## 2020-09-10 ENCOUNTER — Other Ambulatory Visit: Payer: Self-pay

## 2020-09-10 ENCOUNTER — Encounter (HOSPITAL_BASED_OUTPATIENT_CLINIC_OR_DEPARTMENT_OTHER): Payer: Self-pay | Admitting: Emergency Medicine

## 2020-09-10 ENCOUNTER — Emergency Department (HOSPITAL_BASED_OUTPATIENT_CLINIC_OR_DEPARTMENT_OTHER)
Admission: EM | Admit: 2020-09-10 | Discharge: 2020-09-10 | Disposition: A | Discharge status: Home / Self Care | Payer: Self-pay | Attending: Emergency Medicine | Admitting: Emergency Medicine

## 2020-09-10 DIAGNOSIS — M549 Dorsalgia, unspecified: Secondary | ICD-10-CM | POA: Insufficient documentation

## 2020-09-10 DIAGNOSIS — F17210 Nicotine dependence, cigarettes, uncomplicated: Secondary | ICD-10-CM | POA: Insufficient documentation

## 2020-09-10 DIAGNOSIS — R0789 Other chest pain: Secondary | ICD-10-CM

## 2020-09-10 DIAGNOSIS — R079 Chest pain, unspecified: Secondary | ICD-10-CM | POA: Insufficient documentation

## 2020-09-10 HISTORY — DX: Unspecified intracranial injury with loss of consciousness of unspecified duration, initial encounter: S06.9X9A

## 2020-09-10 HISTORY — DX: Unspecified intracranial injury with loss of consciousness status unknown, initial encounter: S06.9XAA

## 2020-09-10 MED ORDER — LIDOCAINE VISCOUS HCL 2 % MT SOLN
15.0000 mL | Freq: Once | OROMUCOSAL | Status: AC
  Administered 2020-09-10: 15 mL via ORAL
  Filled 2020-09-10: qty 15

## 2020-09-10 MED ORDER — METHOCARBAMOL 500 MG PO TABS: 500.0000 mg | ORAL_TABLET | Freq: Three times a day (TID) | ORAL | 0 refills | Status: AC | PRN

## 2020-09-10 MED ORDER — ALUM & MAG HYDROXIDE-SIMETH 200-200-20 MG/5ML PO SUSP
30.0000 mL | Freq: Once | ORAL | Status: AC
  Administered 2020-09-10: 30 mL via ORAL
  Filled 2020-09-10: qty 30

## 2020-09-10 NOTE — ED Notes (Signed)
Pt ambulatory to bathroom by self.  ° °

## 2020-09-10 NOTE — ED Notes (Signed)
Patient transported to X-ray 

## 2020-09-10 NOTE — ED Provider Notes (Signed)
[facility] EMERGENCY DEPARTMENT Provider Note   CSN: [id number] Arrival date & time: 09/10/20  4098     History Chief Complaint  Patient presents with  . Chest wall pain    Justin Taylor is a 19 y.o. male.  HPI Patient presents with chest and back pain.  States began last night.  In the anterior chest into the back.  States on both sides.  Had been feeling a little bad at work yesterday.  However at 8:00 when he was playing videogames developed severe pain in his chest and then into the back.  Has been severe since then.  Worse with movement.  Worse with some deep breaths.  Does not feel short of breath.  Does have a rare cough.  Has not had his Covid vaccines because he does not want to take that.  States he has had some problems like this in the past and was told he had pulled a muscle near his heart.    Past Medical History:  Diagnosis Date  . TBI (traumatic brain injury) (HCC)     There are no problems to display for this patient.   Past Surgical History:  Procedure Laterality Date  . ADENOIDECTOMY    . TONSILLECTOMY    . TYMPANOSTOMY TUBE PLACEMENT         Family History  Problem Relation Age of Onset  . Hypertension Other     Social History   Tobacco Use  . Smoking status: Current Every Day Smoker    Packs/day: 0.10    Types: Cigarettes  . Smokeless tobacco: Never Used  Vaping Use  . Vaping Use: Every day  . Substances: Nicotine  Substance Use Topics  . Alcohol use: Yes    Comment: daily beer  . Drug use: Never    Home Medications Prior to Admission medications   Medication Sig Start Date End Date Taking? Authorizing Provider  esomeprazole (NEXIUM) 40 MG capsule Take 1 capsule (40 mg total) by mouth daily. 05/28/20   Dartha Lodge, PA-C  ondansetron (ZOFRAN ODT) 4 MG disintegrating tablet 4mg  ODT q4 hours prn nausea/vomit 05/28/20   07/28/20, PA-C    Allergies    Primsol [trimethoprim] and Sulfamethoxazole-trimethoprim  Review  of Systems   Review of Systems  Constitutional: Negative for appetite change.  HENT: Negative for congestion.   Respiratory: Negative for shortness of breath.   Cardiovascular: Positive for chest pain.  Gastrointestinal: Negative for abdominal pain, nausea and vomiting.  Genitourinary: Negative for flank pain.  Musculoskeletal: Positive for back pain.  Skin: Negative for rash.  Neurological: Negative for tremors and weakness.  Psychiatric/Behavioral: Negative for confusion.    Physical Exam Updated Vital Signs BP 129/68   Pulse 62   Temp 98.5 F (36.9 C) (Oral)   Resp 19   Ht 5\' 8"  (1.727 m)   Wt 81.6 kg   SpO2 100%   BMI 27.37 kg/m   Physical Exam Vitals reviewed.  HENT:     Head: Atraumatic.     Mouth/Throat:     Mouth: Mucous membranes are moist.  Eyes:     Pupils: Pupils are equal, round, and reactive to light.  Cardiovascular:     Rate and Rhythm: Normal rate and regular rhythm.  Pulmonary:     Breath sounds: No wheezing or rhonchi.  Chest:     Chest wall: Tenderness present.  Abdominal:     Tenderness: There is no abdominal tenderness.  Musculoskeletal:  General: No tenderness.     Right lower leg: No edema.     Left lower leg: No edema.     Comments: Some tenderness to anterior chest and back.  No rash.  No deformity.  Skin:    Capillary Refill: Capillary refill takes less than 2 seconds.     Coloration: Skin is not jaundiced.  Neurological:     Mental Status: He is alert and oriented to person, place, and time.     ED Results / Procedures / Treatments   Labs (all labs ordered are listed, but only abnormal results are displayed) Labs Reviewed - No data to display  EKG EKG Interpretation  Date/Time:  Tuesday September 10 2020 06:49:18 EST Ventricular Rate:  67 PR Interval:    QRS Duration: 96 QT Interval:  380 QTC Calculation: 402 R Axis:   104 Text Interpretation: Sinus arrhythmia Borderline right axis deviation Baseline wander in  lead(s) V1 No old tracing to compare Confirmed by Benjiman Core [phone]) on 09/10/2020 7:08:16 AM   Radiology DG Chest 2 View  Result Date: 09/10/2020 CLINICAL DATA:  Chest pain. EXAM: CHEST - 2 VIEW COMPARISON:  No prior. FINDINGS: Mediastinum hilar structures normal. Lungs are clear. No pleural effusion or pneumothorax. Heart size normal. Mild thoracic spine scoliosis. No acute bony abnormality. IMPRESSION: No acute cardiopulmonary disease. Electronically Signed   By: Maisie Fus  Register   On: 09/10/2020 06:53    Procedures Procedures (including critical care time)  Medications Ordered in ED Medications  alum & mag hydroxide-simeth (MAALOX/MYLANTA) 200-200-20 MG/5ML suspension 30 mL (30 mLs Oral Given 09/10/20 0718)    And  lidocaine (XYLOCAINE) 2 % viscous mouth solution 15 mL (15 mLs Oral Given 09/10/20 6045)    ED Course  I have reviewed the triage vital signs and the nursing notes.  Pertinent labs & imaging results that were available during my care of the patient were reviewed by me and considered in my medical decision making (see chart for details).    MDM Rules/Calculators/A&P                          Patient with chest pain going to the back. Worse with movements. Began last night. 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 Began while playing videogames. EKG reassuring. X-ray reassuring. GI cocktail did not change pain. I think likely musculoskeletal pain. Will treat with muscle relaxers and can take Motrin Tylenol at home. Will discharge. Final Clinical Impression(s) / ED Diagnoses Final diagnoses:  Chest pain    Rx / DC Orders ED Discharge Orders    None       Benjiman Core, MD 09/10/20 [phone]

## 2020-09-10 NOTE — ED Triage Notes (Signed)
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

## 2020-09-30 ENCOUNTER — Other Ambulatory Visit: Payer: Self-pay

## 2020-09-30 ENCOUNTER — Emergency Department (HOSPITAL_BASED_OUTPATIENT_CLINIC_OR_DEPARTMENT_OTHER)
Admission: EM | Admit: 2020-09-30 | Discharge: 2020-09-30 | Disposition: A | Discharge status: Home / Self Care | Payer: Self-pay | Attending: Emergency Medicine | Admitting: Emergency Medicine

## 2020-09-30 ENCOUNTER — Encounter (HOSPITAL_BASED_OUTPATIENT_CLINIC_OR_DEPARTMENT_OTHER): Payer: Self-pay

## 2020-09-30 DIAGNOSIS — R059 Cough, unspecified: Secondary | ICD-10-CM | POA: Insufficient documentation

## 2020-09-30 DIAGNOSIS — Z20822 Contact with and (suspected) exposure to covid-19: Secondary | ICD-10-CM | POA: Insufficient documentation

## 2020-09-30 DIAGNOSIS — F17210 Nicotine dependence, cigarettes, uncomplicated: Secondary | ICD-10-CM | POA: Insufficient documentation

## 2020-09-30 DIAGNOSIS — R439 Unspecified disturbances of smell and taste: Secondary | ICD-10-CM | POA: Insufficient documentation

## 2020-09-30 DIAGNOSIS — R112 Nausea with vomiting, unspecified: Secondary | ICD-10-CM | POA: Insufficient documentation

## 2020-09-30 DIAGNOSIS — R0989 Other specified symptoms and signs involving the circulatory and respiratory systems: Secondary | ICD-10-CM | POA: Insufficient documentation

## 2020-09-30 DIAGNOSIS — R197 Diarrhea, unspecified: Secondary | ICD-10-CM | POA: Insufficient documentation

## 2020-09-30 DIAGNOSIS — J069 Acute upper respiratory infection, unspecified: Secondary | ICD-10-CM

## 2020-09-30 LAB — RESP PANEL BY RT-PCR (FLU A&B, COVID) ARPGX2
Influenza A by PCR: NEGATIVE
Influenza B by PCR: NEGATIVE
SARS Coronavirus 2 by RT PCR: NEGATIVE

## 2020-09-30 NOTE — ED Triage Notes (Signed)
Pt reports cough, sore throat, and n/v/d. Pt states due to a previous incident he cannot smell and can barely taste. Today he noticed he can no longer taste. Pt reports abdominal and lower back pain. Pt states his symptoms started on Friday. Pt recently traveled to [place].

## 2020-09-30 NOTE — Discharge Instructions (Addendum)
Take over-the-counter medications as needed for symptomatic relief.  Return to the emergency department if you develop chest pains, difficulty breathing, or other new and concerning symptoms.

## 2020-09-30 NOTE — ED Provider Notes (Signed)
[facility] EMERGENCY DEPARTMENT Provider Note   CSN: [id number] Arrival date & time: 09/30/20  4097     History Chief Complaint  Patient presents with  . Cough  . Abdominal Pain    Justin Taylor is a 19 y.o. male.  Patient is a 19 year old male with history of traumatic brain injury.  Patient presents today with complaints of cough, scratchy throat, nausea, vomiting, and diarrhea.  This is been ongoing for the past 3 days.  All has been nonbloody.  He reports loss of taste.  He denies any Covid exposures.  He denies prior Covid infection and has not received the vaccine.  He went to work this morning, but was told to leave secondary to this illness.  He denies any aggravating or alleviating factors  The history is provided by the patient.       Past Medical History:  Diagnosis Date  . TBI (traumatic brain injury) (HCC)     There are no problems to display for this patient.   Past Surgical History:  Procedure Laterality Date  . ADENOIDECTOMY    . TONSILLECTOMY    . TYMPANOSTOMY TUBE PLACEMENT         Family History  Problem Relation Age of Onset  . Hypertension Other     Social History   Tobacco Use  . Smoking status: Current Every Day Smoker    Packs/day: 0.10    Types: Cigarettes  . Smokeless tobacco: Never Used  Vaping Use  . Vaping Use: Every day  . Substances: Nicotine  Substance Use Topics  . Alcohol use: Yes    Comment: daily beer  . Drug use: Never    Home Medications Prior to Admission medications   Medication Sig Start Date End Date Taking? Authorizing Provider  esomeprazole (NEXIUM) 40 MG capsule Take 1 capsule (40 mg total) by mouth daily. 05/28/20   Dartha Lodge, PA-C  methocarbamol (ROBAXIN) 500 MG tablet Take 1 tablet (500 mg total) by mouth every 8 (eight) hours as needed for muscle spasms. 09/10/20   Benjiman Core, MD  ondansetron (ZOFRAN ODT) 4 MG disintegrating tablet 4mg  ODT q4 hours prn nausea/vomit 05/28/20   07/28/20, PA-C    Allergies    Primsol [trimethoprim] and Sulfamethoxazole-trimethoprim  Review of Systems   Review of Systems  All other systems reviewed and are negative.   Physical Exam Updated Vital Signs BP 123/81 (BP Location: Right Arm)   Pulse (!) 112   Temp 99.7 F (37.6 C) (Oral)   Resp 18   Ht 5\' 8"  (1.727 m)   Wt 81.9 kg   SpO2 100%   BMI 27.46 kg/m   Physical Exam Vitals and nursing note reviewed.  Constitutional:      General: He is not in acute distress.    Appearance: He is well-developed. He is not diaphoretic.  HENT:     Head: Normocephalic and atraumatic.     Mouth/Throat:     Mouth: Mucous membranes are moist.     Pharynx: No pharyngeal swelling or oropharyngeal exudate.  Cardiovascular:     Rate and Rhythm: Normal rate and regular rhythm.     Heart sounds: No murmur heard.  No friction rub.  Pulmonary:     Effort: Pulmonary effort is normal. No respiratory distress.     Breath sounds: Normal breath sounds. No wheezing or rales.  Abdominal:     General: Bowel sounds are normal. There is no distension.  Palpations: Abdomen is soft.     Tenderness: There is no abdominal tenderness.  Musculoskeletal:        General: Normal range of motion.     Cervical back: Normal range of motion and neck supple.  Skin:    General: Skin is warm and dry.  Neurological:     Mental Status: He is alert and oriented to person, place, and time.     Coordination: Coordination normal.     ED Results / Procedures / Treatments   Labs (all labs ordered are listed, but only abnormal results are displayed) Labs Reviewed - No data to display  EKG None  Radiology No results found.  Procedures Procedures (including critical care time)  Medications Ordered in ED Medications - No data to display  ED Course  I have reviewed the triage vital signs and the nursing notes.  Pertinent labs & imaging results that were available during my care of the patient were  reviewed by me and considered in my medical decision making (see chart for details).    MDM Rules/Calculators/A&P  Covid test is negative.  Symptoms likely viral in nature.  Patient seems appropriate for discharge with as needed return.  Final Clinical Impression(s) / ED Diagnoses Final diagnoses:  None    Rx / DC Orders ED Discharge Orders    None       Geoffery Lyons, MD 09/30/20 [phone]

## 2021-08-10 IMAGING — US US ABDOMEN LIMITED
1 series · 14 of 25 positions shown · non-contrast
Comparison: None.

CLINICAL DATA: Nausea and vomiting

EXAM:
ULTRASOUND ABDOMEN LIMITED RIGHT UPPER QUADRANT

[Series 1: us abdomen limited · 14 of 31 slices shown]
[im 1/31]
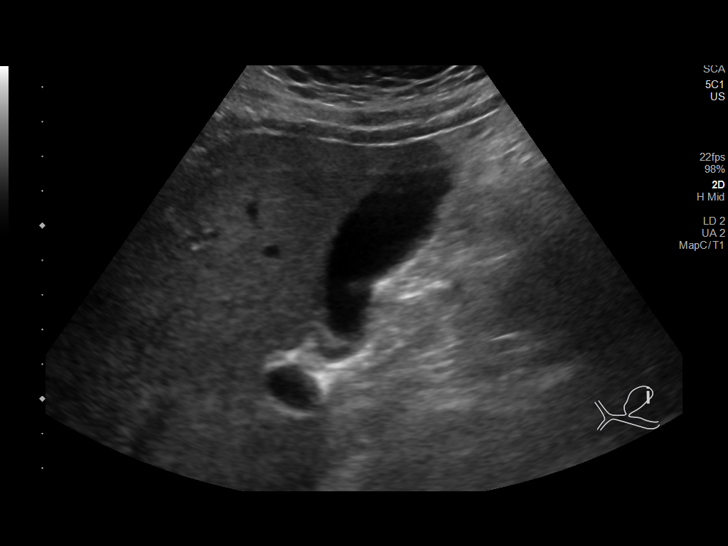
[im 3/31]
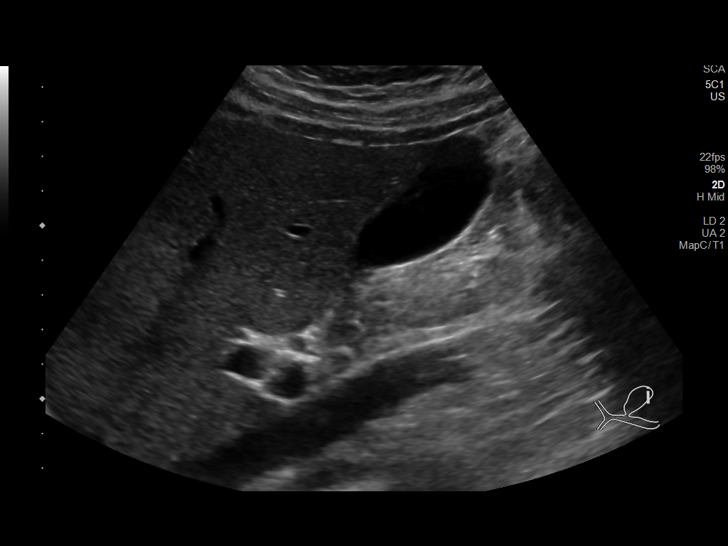
[im 6/31]
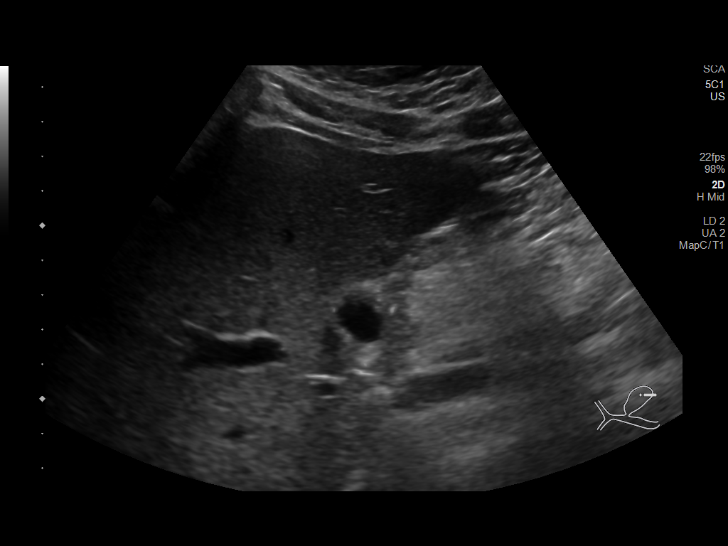
[im 8/31]
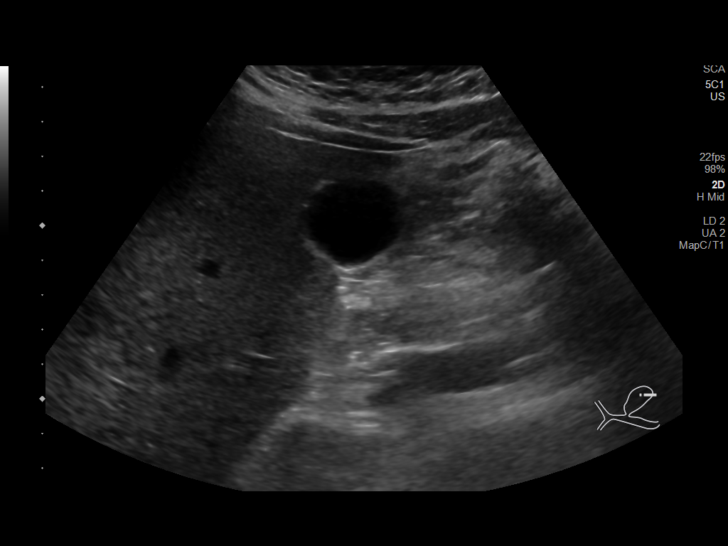
[im 11/31]
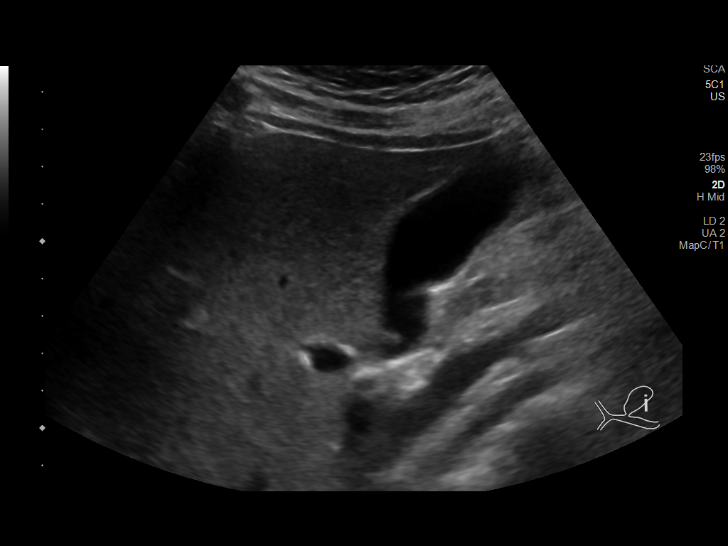
[im 12/31]
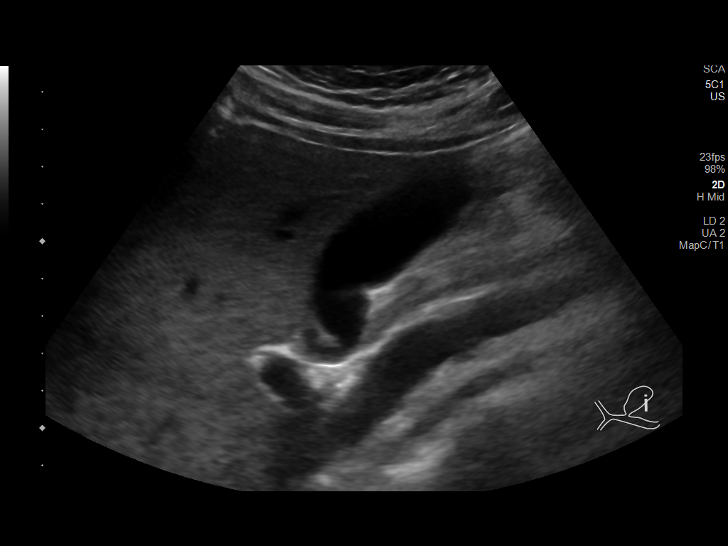
[im 14/31]
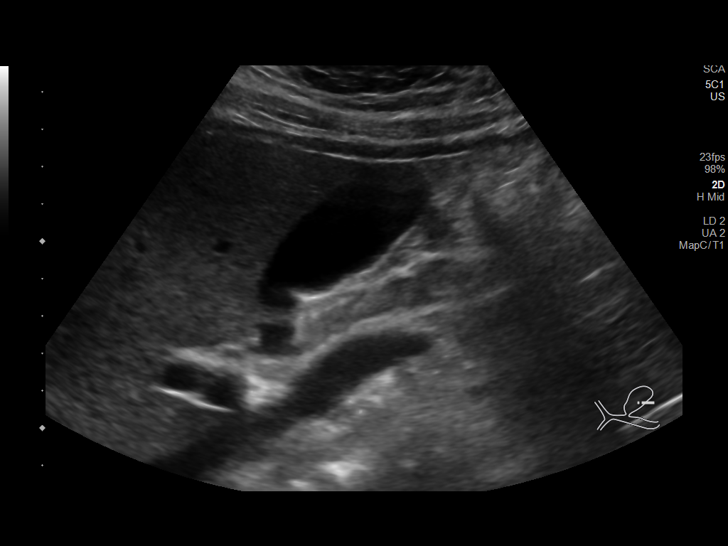
[im 17/31]
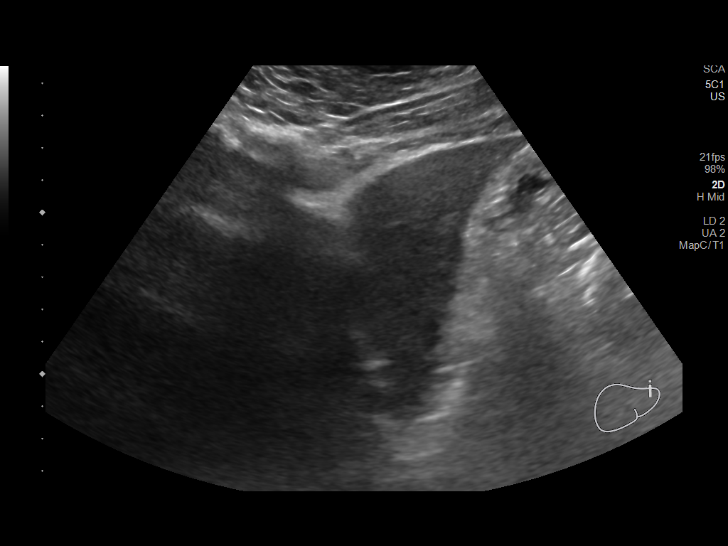
[im 19/31]
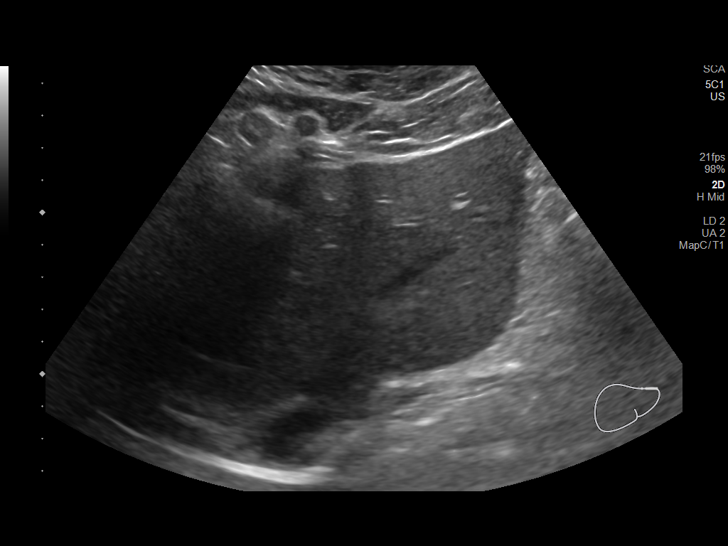
[im 21/31]
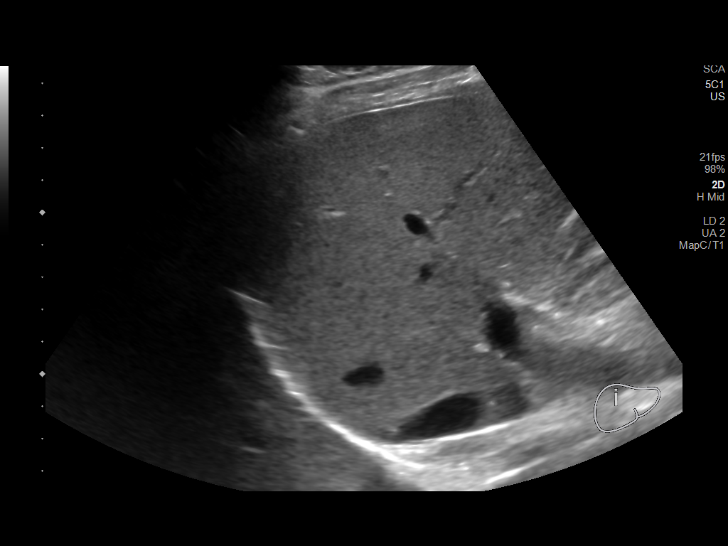
[im 23/31]
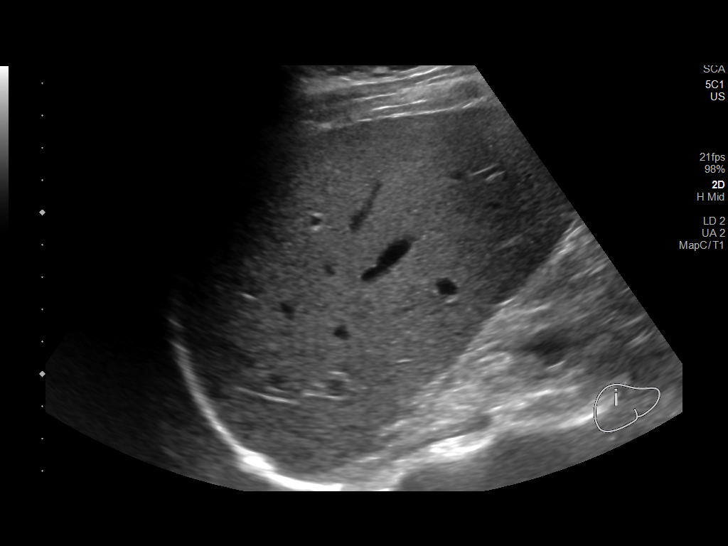
[im 26/31]
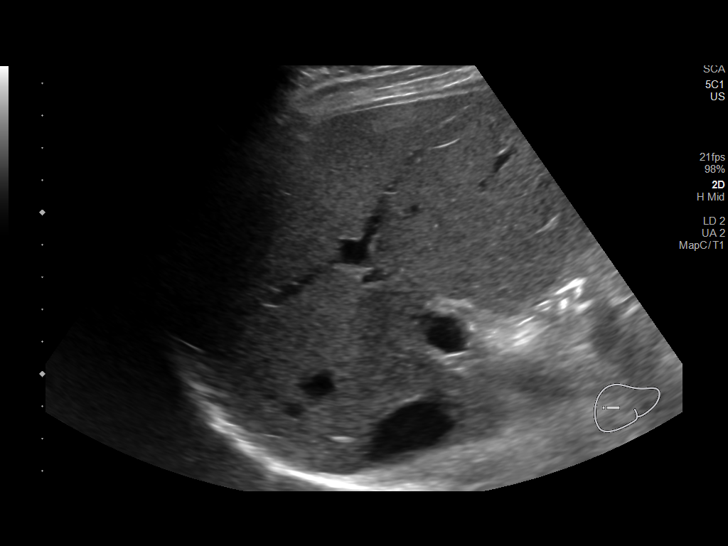
[im 28/31]
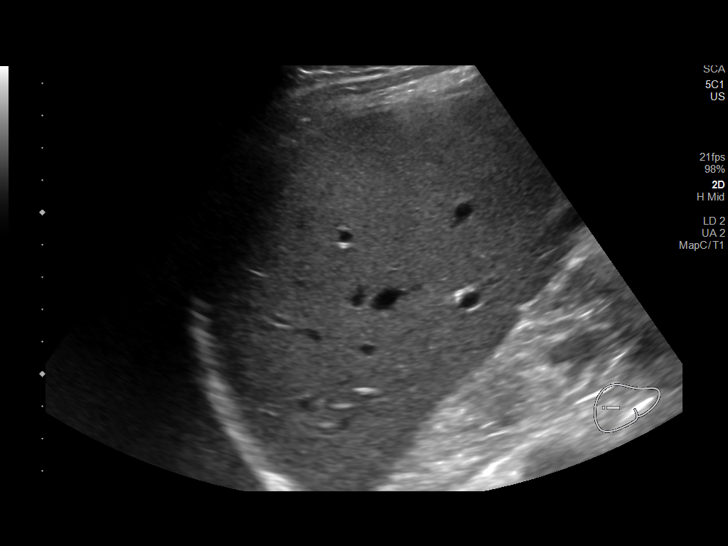
[im 31/31]
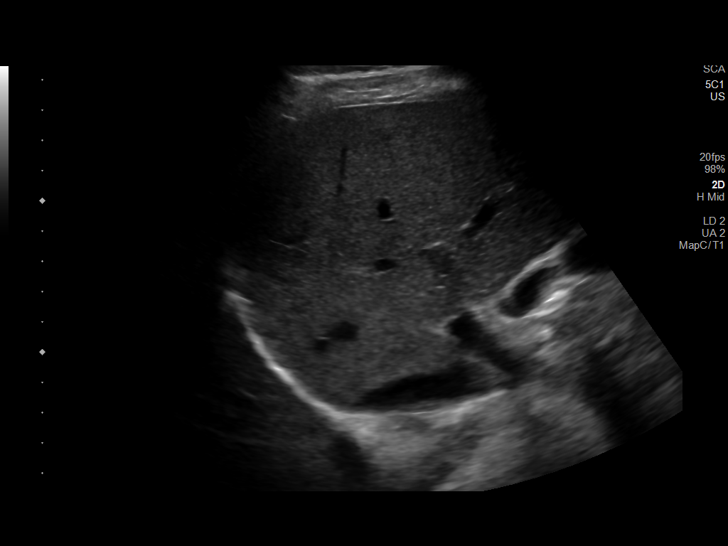

[14 of 25 positions shown; findings below may reference images not displayed]

FINDINGS: Gallbladder:

No gallstones or wall thickening visualized. No sonographic Murphy
sign noted by sonographer.

Common bile duct:

Diameter: 4 mm, normal

Liver:

No focal lesion identified. Within normal limits in parenchymal
echogenicity. Portal vein is patent on color Doppler imaging with
normal direction of blood flow towards the liver.

Other: None.
IMPRESSION: No sonographic etiology for abdominal pain identified.

## 2021-11-23 IMAGING — CR DG CHEST 2V
2 series · 2 of 2 positions shown · non-contrast
Comparison: No prior.

CLINICAL DATA: Chest pain.

EXAM:
CHEST - 2 VIEW

[w chest pa]
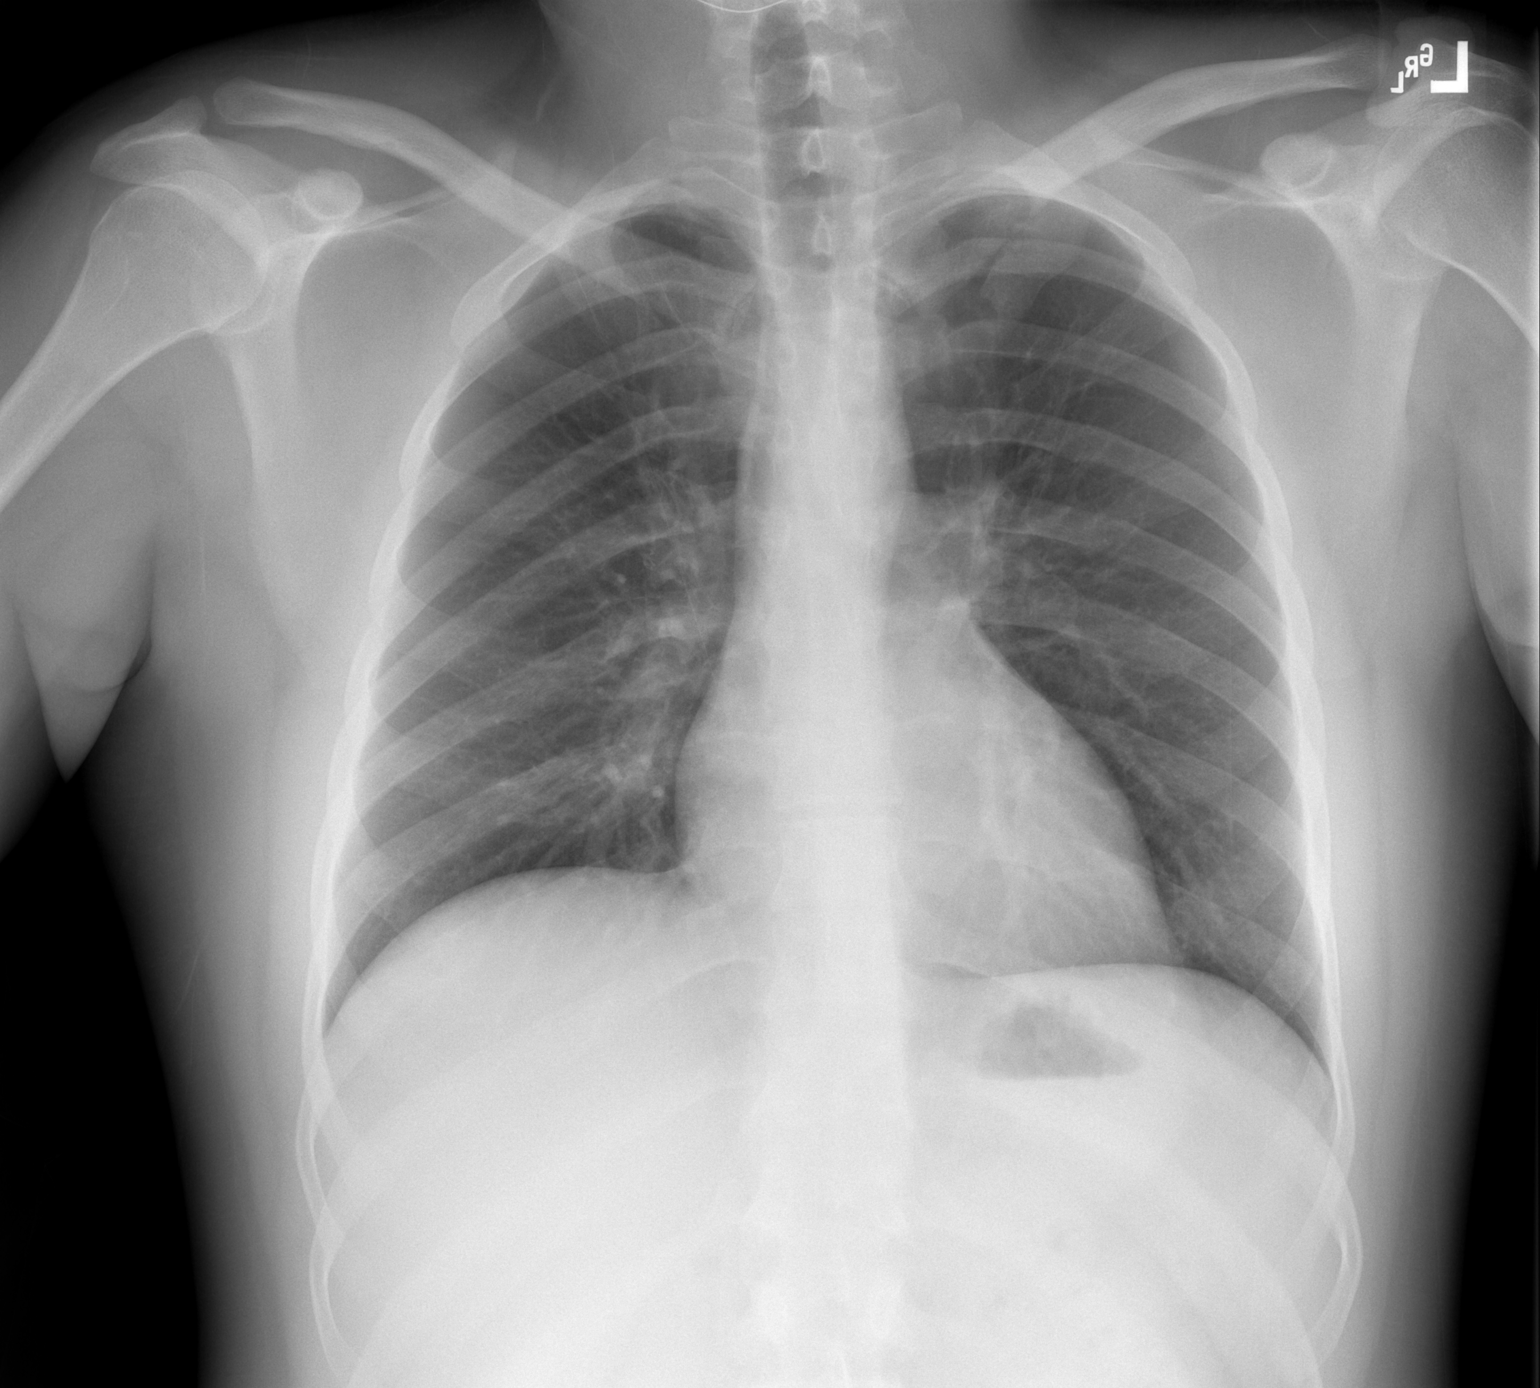

[w chest lat]
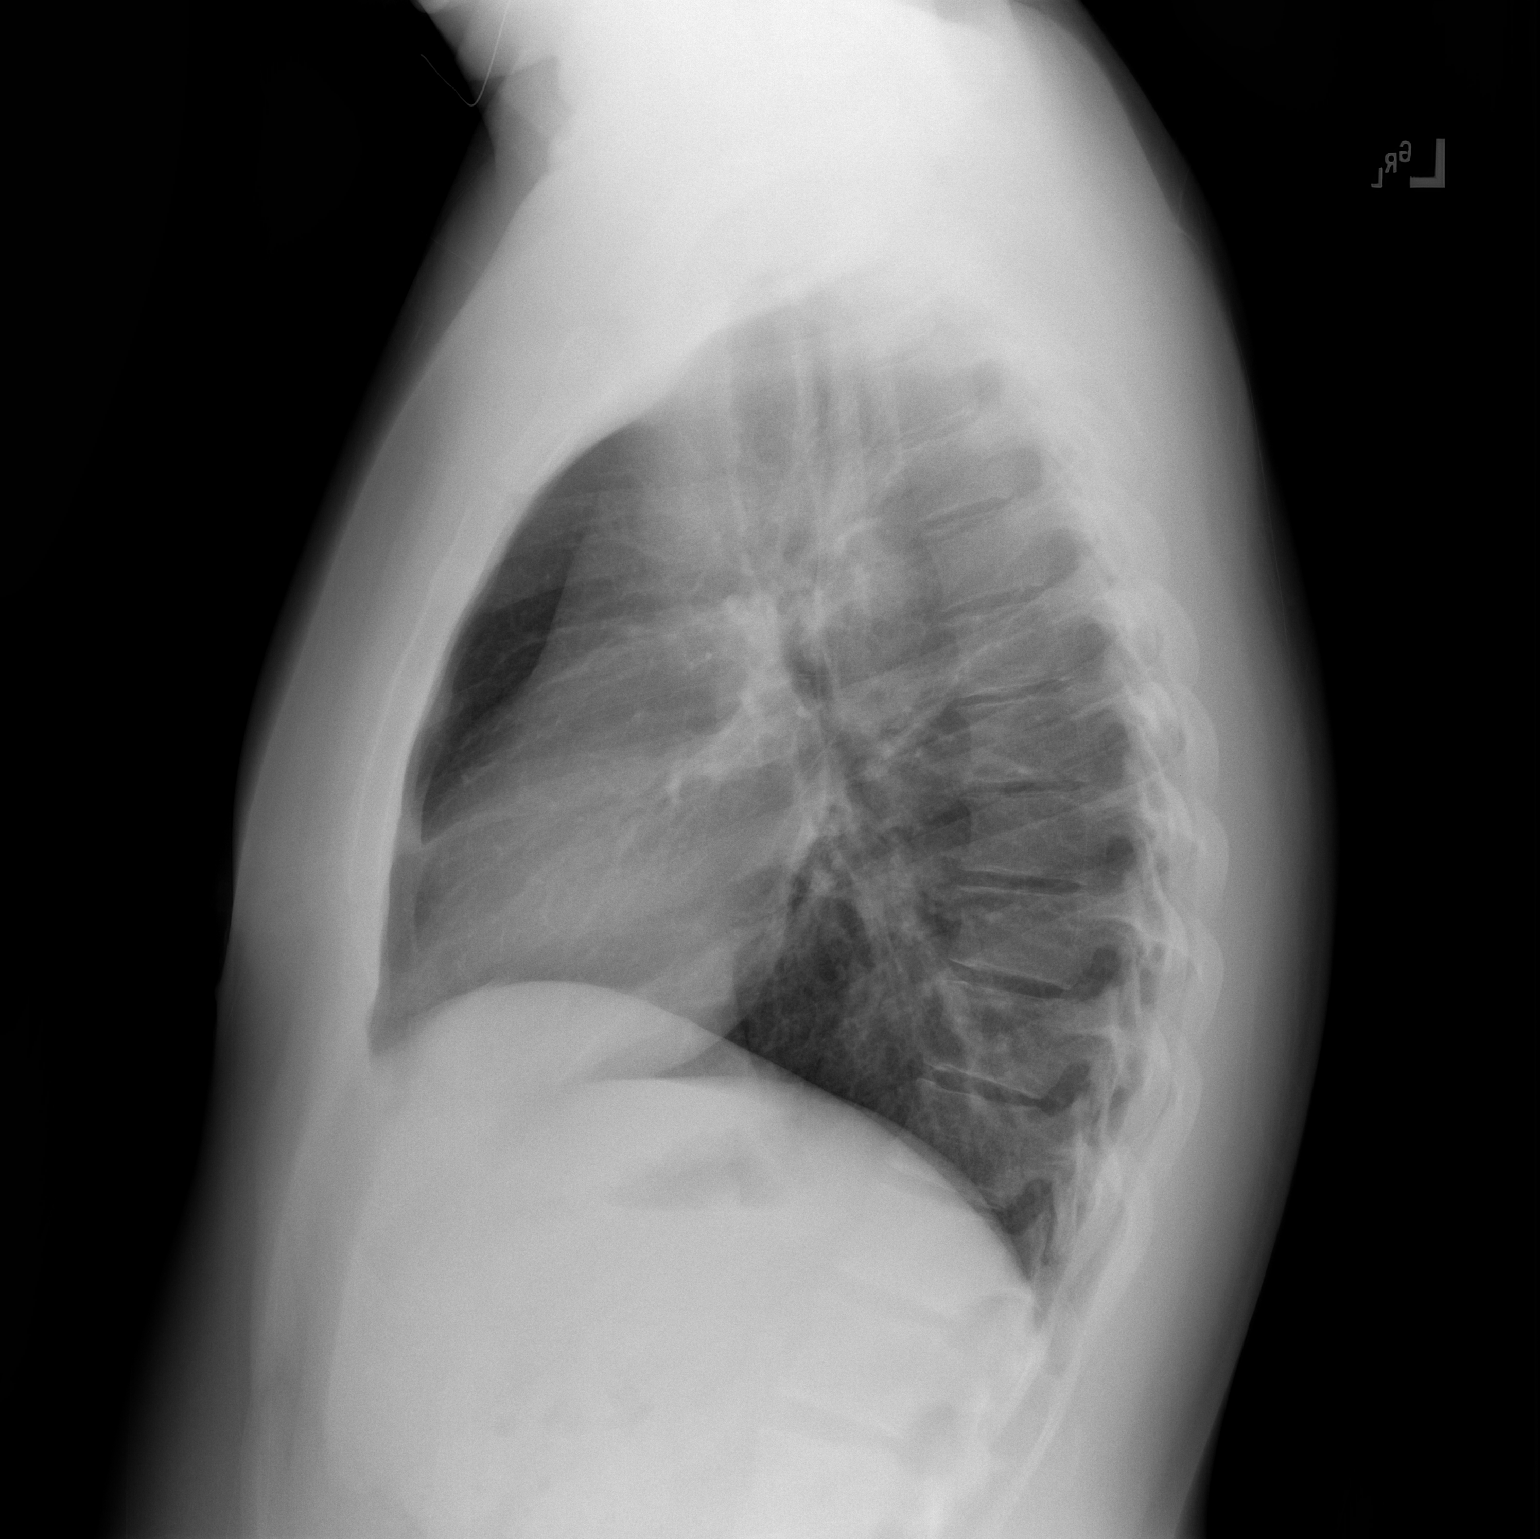

[2 of 2 positions shown; findings below may reference images not displayed]

FINDINGS: Mediastinum hilar structures normal. Lungs are clear. No pleural
effusion or pneumothorax. Heart size normal. Mild thoracic spine
scoliosis. No acute bony abnormality.
IMPRESSION: No acute cardiopulmonary disease.
